# Patient Record
Sex: Male | Born: 1940 | Race: Black or African American | Hispanic: No | Marital: Married | Smoking: Former smoker
Health system: Southern US, Community
[De-identification: ages and names within clinical notes are randomized; demographics above are authoritative.]

---

## 2006-10-15 ENCOUNTER — Emergency Department (HOSPITAL_COMMUNITY): Admission: EM | Admit: 2006-10-15 | Discharge: 2006-10-15 | Payer: Self-pay | Admitting: Emergency Medicine

## 2021-01-07 ENCOUNTER — Other Ambulatory Visit: Payer: Self-pay | Admitting: Family Medicine

## 2021-01-07 ENCOUNTER — Other Ambulatory Visit (HOSPITAL_COMMUNITY): Payer: Self-pay | Admitting: Family Medicine

## 2021-01-07 DIAGNOSIS — R748 Abnormal levels of other serum enzymes: Secondary | ICD-10-CM

## 2021-01-19 ENCOUNTER — Other Ambulatory Visit: Payer: Self-pay

## 2021-01-19 ENCOUNTER — Ambulatory Visit (HOSPITAL_COMMUNITY)
Admission: RE | Admit: 2021-01-19 | Discharge: 2021-01-19 | Disposition: A | Discharge status: Home / Self Care | Payer: Medicare HMO | Source: Ambulatory Visit | Attending: Family Medicine | Admitting: Family Medicine

## 2021-01-19 DIAGNOSIS — R748 Abnormal levels of other serum enzymes: Secondary | ICD-10-CM | POA: Insufficient documentation

## 2021-01-21 ENCOUNTER — Other Ambulatory Visit: Payer: Self-pay | Admitting: Family Medicine

## 2021-01-22 ENCOUNTER — Other Ambulatory Visit: Payer: Self-pay | Admitting: Family Medicine

## 2021-01-22 ENCOUNTER — Other Ambulatory Visit (HOSPITAL_COMMUNITY): Payer: Self-pay | Admitting: Family Medicine

## 2021-01-22 DIAGNOSIS — R160 Hepatomegaly, not elsewhere classified: Secondary | ICD-10-CM

## 2021-01-22 DIAGNOSIS — K7460 Unspecified cirrhosis of liver: Secondary | ICD-10-CM

## 2021-01-22 DIAGNOSIS — K760 Fatty (change of) liver, not elsewhere classified: Secondary | ICD-10-CM

## 2021-01-29 ENCOUNTER — Ambulatory Visit (HOSPITAL_COMMUNITY)
Admission: RE | Admit: 2021-01-29 | Discharge: 2021-01-29 | Disposition: A | Discharge status: Home / Self Care | Payer: Medicare HMO | Source: Ambulatory Visit | Attending: Family Medicine | Admitting: Family Medicine

## 2021-01-29 DIAGNOSIS — K7460 Unspecified cirrhosis of liver: Secondary | ICD-10-CM

## 2021-01-29 DIAGNOSIS — K760 Fatty (change of) liver, not elsewhere classified: Secondary | ICD-10-CM

## 2021-01-29 DIAGNOSIS — R160 Hepatomegaly, not elsewhere classified: Secondary | ICD-10-CM

## 2021-01-29 MED ORDER — GADOBUTROL 1 MMOL/ML IV SOLN
10.0000 mL | Freq: Once | INTRAVENOUS | Status: AC | PRN
  Administered 2021-01-29: 10 mL via INTRAVENOUS

## 2022-05-08 IMAGING — MR MR ABDOMEN WO/W CM
20 series · 48 of 48 positions shown · IV contrast (gadavist)
Comparison: Ultrasound 01/19/2021

CLINICAL DATA: Abnormal liver function studies. History of
cirrhosis and increased hepatic echogenicity on ultrasound.

EXAM:
MRI ABDOMEN WITHOUT AND WITH CONTRAST
TECHNIQUE: Multiplanar multisequence MR imaging of the abdomen was performed
both before and after the administration of intravenous contrast.
CONTRAST:  10mL GADAVIST GADOBUTROL 1 MMOL/ML IV SOLN

[Series 3: cor haste · coronal · 6.0mm · 1.41mm/px · 2 of 34 slices shown]
[im 1/34]
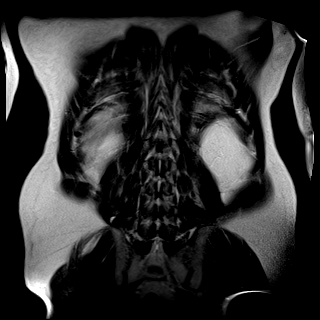
[im 34/34]
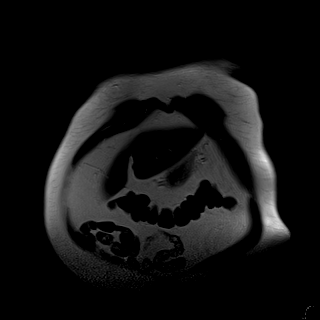

[Series 6: T2 fat-sat · axial · 6.0mm · 1.41mm/px · z∈[-182,+56]mm · 2 of 34 slices shown]
[im 1/34]
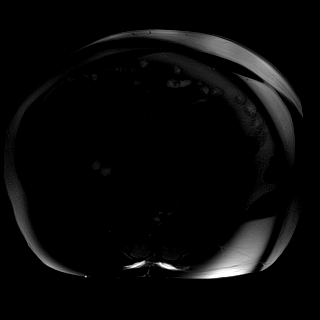
[im 34/34]
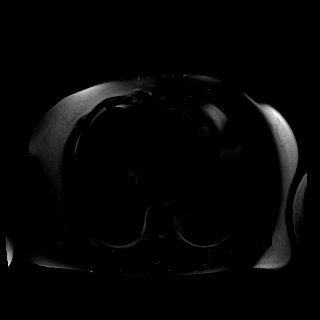

[Series 7: DWI · axial · 6.0mm · 1.68mm/px · z∈[-185,+67]mm · 2 of 36 slices shown (1 of 4)]
[im 1/36]
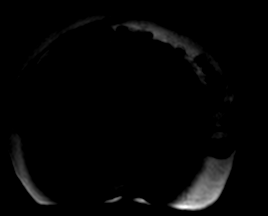
[im 36/36]
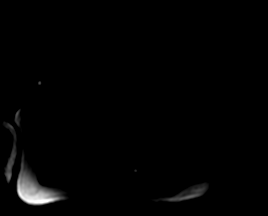

[Series 7: DWI · axial · 6.0mm · 1.68mm/px · 1 of 36 slices shown (2 of 4)]
[im 1/36]
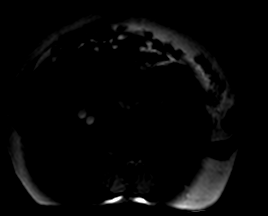

[Series 7: DWI · axial · 6.0mm · 1.68mm/px · 1 of 36 slices shown (3 of 4)]
[im 1/36]
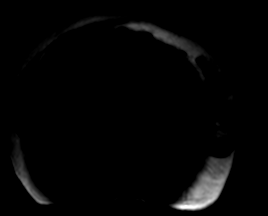

[Series 8: DWI · axial · 6.0mm · 1.68mm/px · 1 of 36 slices shown (4 of 4)]
[im 1/36]
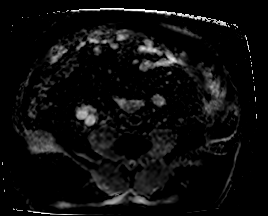

[Series 9: ax in and · axial · 3.0mm · 1.41mm/px · z∈[-191,+70]mm · 3 of 88 slices shown (1 of 2)]
[im 1/88]
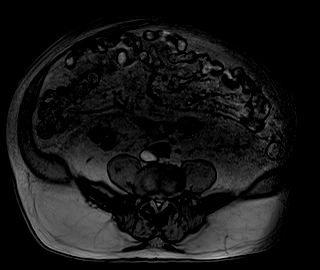
[im 44/88]
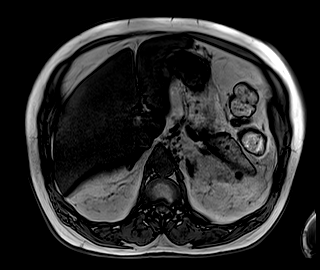
[im 88/88]
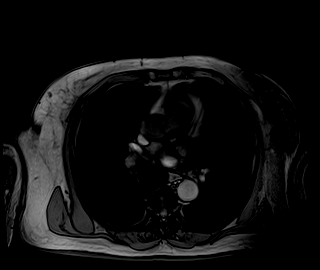

[Series 10: ax in and · axial · 3.0mm · 1.41mm/px · z∈[-191,+70]mm · 3 of 88 slices shown (2 of 2)]
[im 1/88]
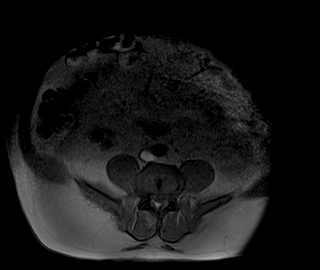
[im 44/88]
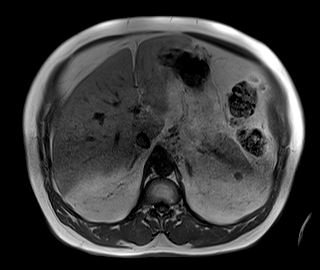
[im 88/88]
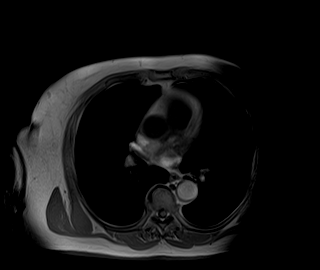

[Series 11: bSSFP · axial · 6.0mm · 0.88mm/px · z∈[-216,+66]mm · 2 of 48 slices shown]
[im 1/48]
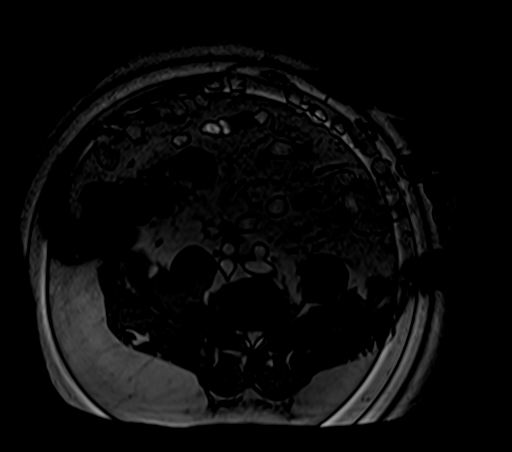
[im 48/48]
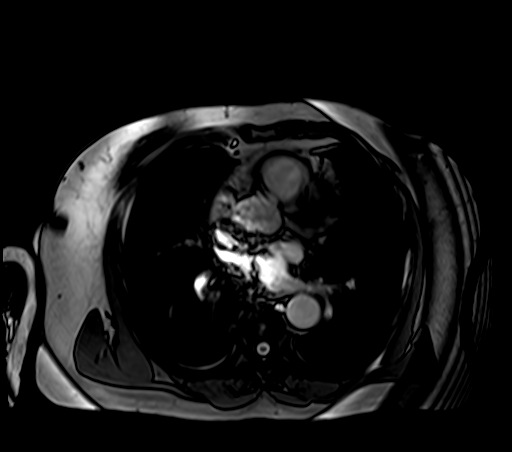

[Series 12: ax haste bh · axial · 6.0mm · 1.41mm/px · 1 of 36 slices shown]
[im 1/36]
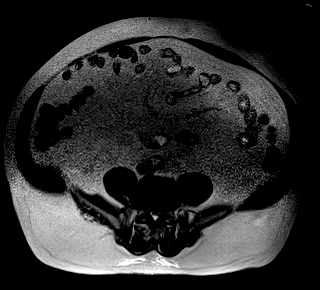

[Series 13: T1 dynamic · axial · 3.0mm · 1.41mm/px · z∈[-191,+70]mm · 3 of 88 slices shown (1 of 9)]
[im 1/88]
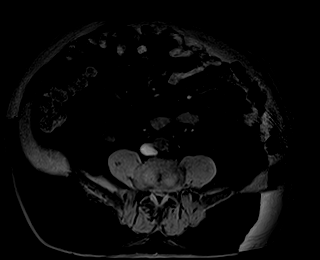
[im 44/88]
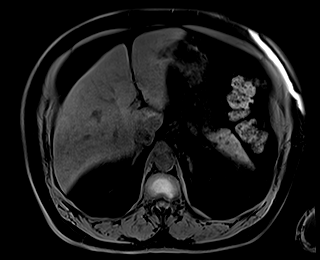
[im 88/88]
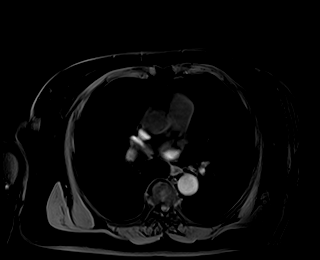

[Series 15: T1 dynamic · axial · 3.0mm · 1.41mm/px · z∈[-191,+70]mm · 3 of 88 slices shown (2 of 9)]
[im 1/88]
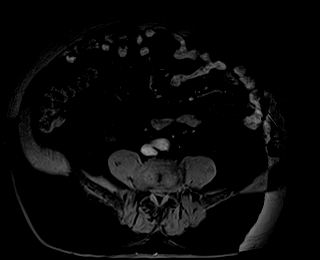
[im 44/88]
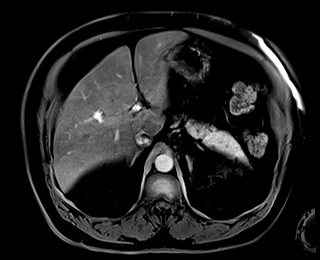
[im 88/88]
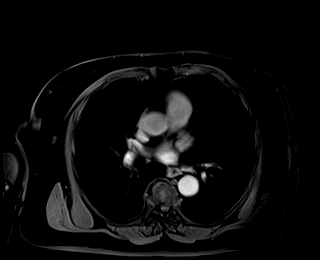

[Series 16: T1 dynamic · axial · 3.0mm · 1.41mm/px · z∈[-191,+70]mm · 3 of 88 slices shown (3 of 9)]
[im 1/88]
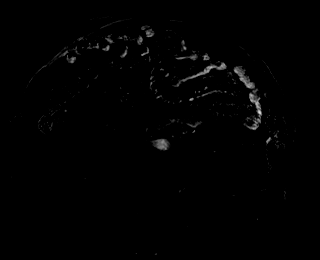
[im 44/88]
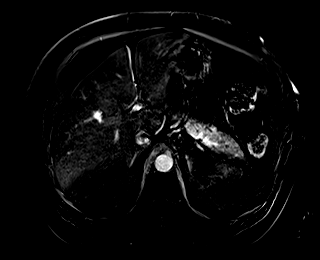
[im 88/88]
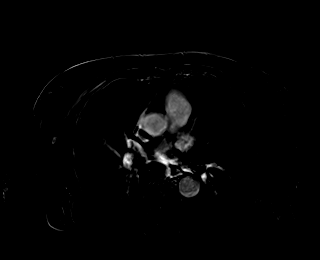

[Series 17: T1 dynamic · axial · 3.0mm · 1.41mm/px · z∈[-191,+70]mm · 3 of 88 slices shown (4 of 9)]
[im 1/88]
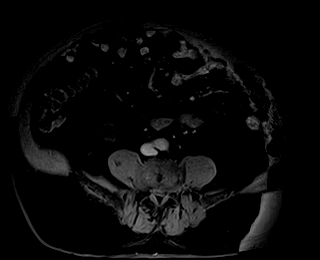
[im 44/88]
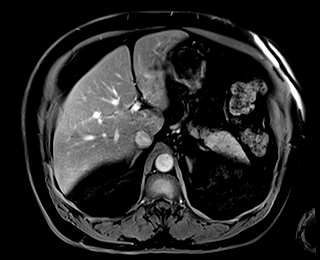
[im 88/88]
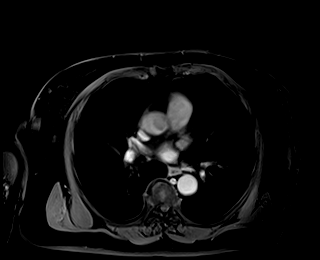

[Series 18: T1 dynamic · axial · 3.0mm · 1.41mm/px · z∈[-191,+70]mm · 3 of 88 slices shown (5 of 9)]
[im 1/88]
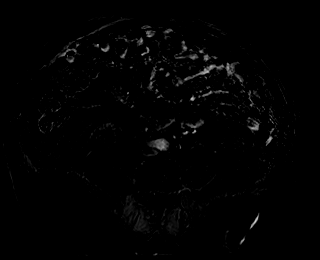
[im 44/88]
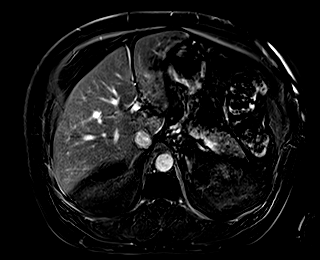
[im 88/88]
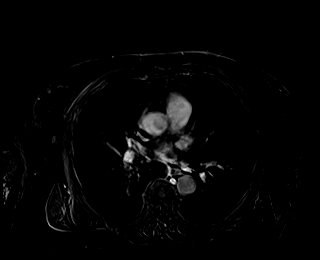

[Series 19: T1 dynamic · axial · 3.0mm · 1.41mm/px · z∈[-191,+70]mm · 3 of 88 slices shown (6 of 9)]
[im 1/88]
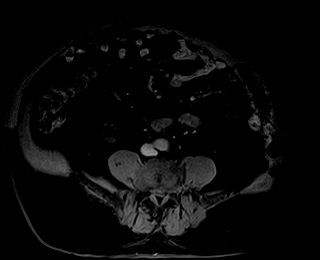
[im 44/88]
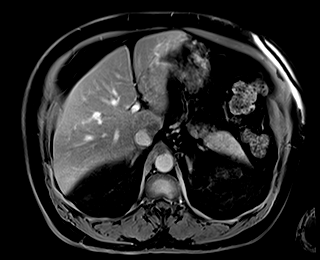
[im 88/88]
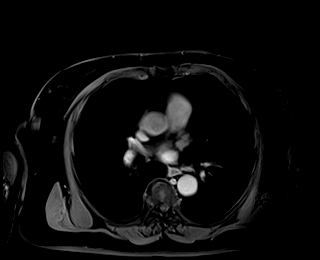

[Series 20: T1 dynamic · axial · 3.0mm · 1.41mm/px · z∈[-191,+70]mm · 3 of 88 slices shown (7 of 9)]
[im 1/88]
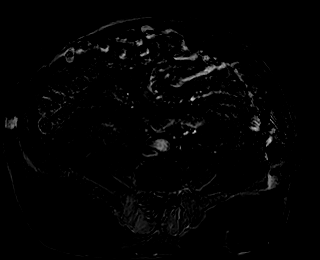
[im 44/88]
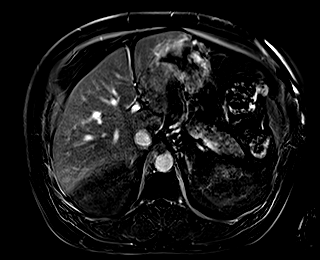
[im 88/88]
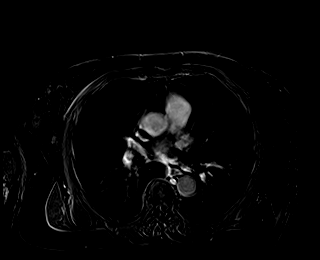

[Series 21: T1 dynamic · axial · 3.0mm · 1.41mm/px · z∈[-191,+70]mm · 3 of 88 slices shown (8 of 9)]
[im 1/88]
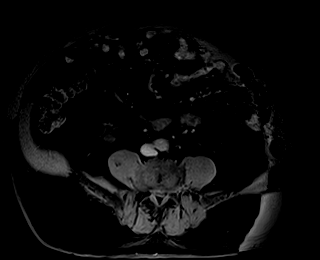
[im 44/88]
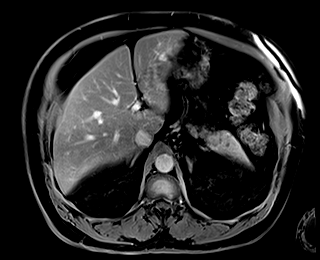
[im 88/88]
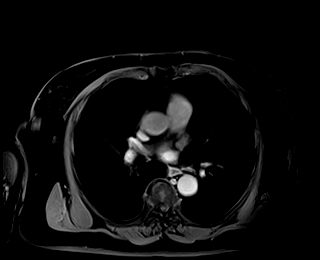

[Series 22: T1 dynamic · axial · 3.0mm · 1.41mm/px · z∈[-191,+70]mm · 3 of 88 slices shown (9 of 9)]
[im 1/88]
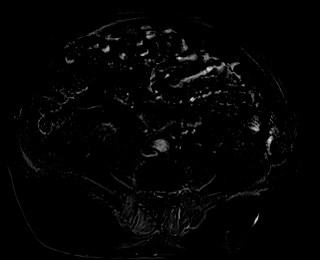
[im 44/88]
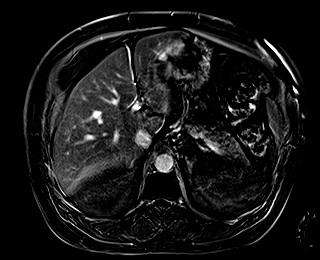
[im 88/88]
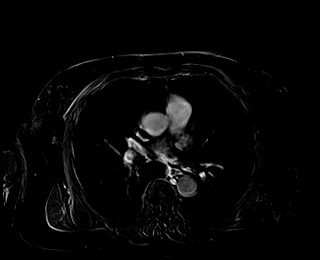

[Series 23: T1 dynamic post-contrast · coronal · 4.0mm · 1.41mm/px · 3 of 80 slices shown]
[im 1/80]
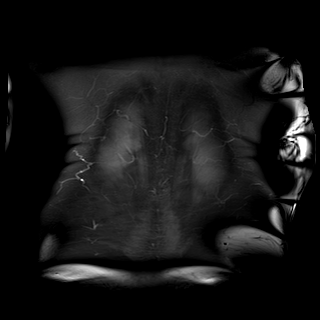
[im 40/80]
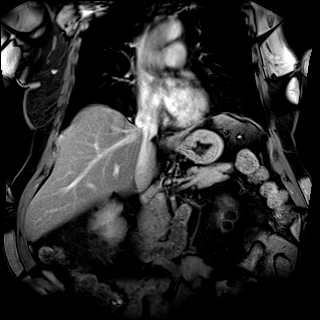
[im 80/80]
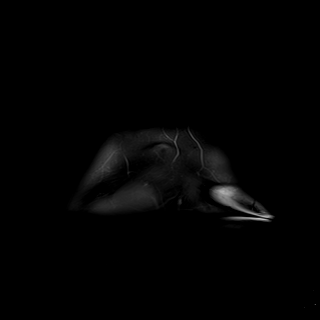

[48 of 48 positions shown; findings below may reference images not displayed]

FINDINGS: Lower chest:  The visualized lower chest appears unremarkable.

Hepatobiliary: Diffuse hepatic steatosis with marked loss of signal
throughout the liver on the gradient echo opposed phase images.
There are no definite morphologic changes of cirrhosis. There are no
arterial phase enhancing lesions or other focal hepatic
abnormalities. The portal and hepatic veins appear normal. The
gallbladder is incompletely distended. No evidence of gallstones,
gallbladder wall thickening or biliary dilatation.

Pancreas: Unremarkable. No pancreatic ductal dilatation or
surrounding inflammatory changes.

Spleen: Normal in size without focal abnormality.

Adrenals/Urinary Tract: Both adrenal glands appear normal. There are
numerous simple appearing renal cysts bilaterally. No evidence of
enhancing renal mass or hydronephrosis.

Stomach/Bowel: Ingested material in the stomach. No evidence of
bowel distension, wall thickening or surrounding inflammation.

Vascular/Lymphatic: There are prominent lymph nodes in the porta
hepatis, likely reactive. No retroperitoneal lymphadenopathy. No
acute vascular findings. Aortic atherosclerosis noted.

Other: The visualized abdominal wall is intact.  No ascites.

Musculoskeletal: No acute or significant osseous findings.
Multilevel lumbar spondylosis.
IMPRESSION: 1. Severe hepatic steatosis. No definite morphologic changes of
cirrhosis or focal hepatic abnormality identified.
2. No signs of portal hypertension
3. Prominent lymph nodes in the porta hepatis, nonspecific, but
likely reactive.
4. Numerous simple appearing renal cysts bilaterally.

## 2022-05-24 ENCOUNTER — Ambulatory Visit (HOSPITAL_COMMUNITY)
Admission: RE | Admit: 2022-05-24 | Discharge: 2022-05-24 | Disposition: A | Discharge status: Home / Self Care | Payer: Medicare HMO | Source: Ambulatory Visit | Attending: Internal Medicine | Admitting: Internal Medicine

## 2022-05-24 ENCOUNTER — Encounter: Payer: Self-pay | Admitting: Internal Medicine

## 2022-05-24 ENCOUNTER — Ambulatory Visit: Payer: Medicare HMO | Admitting: Internal Medicine

## 2022-05-24 VITALS — BP 124/78 | HR 84 | Temp 98.7°F | Ht 68.0 in | Wt 235.0 lb

## 2022-05-24 DIAGNOSIS — I10 Essential (primary) hypertension: Secondary | ICD-10-CM | POA: Diagnosis not present

## 2022-05-24 DIAGNOSIS — R058 Other specified cough: Secondary | ICD-10-CM | POA: Insufficient documentation

## 2022-05-24 MED ORDER — METHYLPREDNISOLONE ACETATE 80 MG/ML IJ SUSP
120.0000 mg | Freq: Once | INTRAMUSCULAR | Status: AC
  Administered 2022-05-24: 120 mg via INTRAMUSCULAR

## 2022-05-24 MED ORDER — PANTOPRAZOLE SODIUM 40 MG PO TBEC: 40.0000 mg | DELAYED_RELEASE_TABLET | Freq: Every day | ORAL | 2 refills | Status: DC

## 2022-05-24 MED ORDER — VALSARTAN-HYDROCHLOROTHIAZIDE 160-12.5 MG PO TABS: 1.0000 | ORAL_TABLET | Freq: Every day | ORAL | 11 refills | Status: AC

## 2022-05-24 NOTE — Assessment & Plan Note (Addendum)
Onset  ? 1980s -  Allergy screen 05/24/2022 >  Eos 0. /  IgE   - trial off ACEi and on aggressive gerd rx plus 1st gen H1 blockers per guidelines   05/24/2022 >>>   Upper airway cough syndrome (previously labeled PNDS),  is so named because it's frequently impossible to sort out how much is  CR/sinusitis with freq throat clearing (which can be related to primary GERD)   vs  causing  secondary (" extra esophageal")  GERD from wide swings in gastric pressure that occur with throat clearing, often  promoting self use of mint and menthol lozenges that reduce the lower esophageal sphincter tone and exacerbate the problem further in a cyclical fashion.   These are the same pts (now being labeled as having "irritable larynx syndrome" by some cough centers) who not infrequently have a history of having failed to tolerate ace inhibitors,  dry powder inhalers or biphosphonates or report having atypical/extraesophageal reflux symptoms that don't respond to standard doses of PPI  and are easily confused as having aecopd or asthma flares by even experienced allergists/ pulmonologists (myself included).   rec rx as above then regroup in 4 weeks

## 2022-05-24 NOTE — Progress Notes (Unsigned)
Ronald Rubio, male    DOB: [birth year]    MRN: [id number]   Brief patient profile:  27  yobm  quit smoking 2018  referred to pulmonary clinic in [place]  05/24/2022 by Noland Fordyce for cough  x years  and sob since covid but can't remember when that was but not admitted.      History of Present Illness  05/24/2022  Pulmonary/ 1st office eval/ Ronald Rubio / [place] [facility]  Chief Complaint  Patient presents with   Consult    Chronic cough   Dyspnea:  only with digging / yardwork not better with saba  Cough: worse  hs  no am flare / no significant production assoc with sense  nasal congestion/pnds  Sleep: bed flat is flat  SABA use: twice  a day   No obvious day to day or daytime pattern/variability or assoc excess/ purulent sputum or mucus plugs or hemoptysis or cp or chest tightness, subjective wheeze or overt  hb symptoms.    . Also denies any obvious fluctuation of symptoms with weather or environmental changes or other aggravating or alleviating factors except as outlined above   No unusual exposure hx or h/o childhood pna/ asthma or knowledge of premature birth.  Current Allergies, Complete Past Medical History, Past Surgical History, Family History, and Social History were reviewed in [facility] Rubio record.  ROS  The following are not active complaints unless bolded Hoarseness, sore throat, dysphagia, dental problems, itching, sneezing,  nasal congestion or discharge of excess mucus or purulent secretions, ear ache,   fever, chills, sweats, unintended wt loss or wt gain, classically pleuritic or exertional cp,  orthopnea pnd or arm/hand swelling  or leg swelling, presyncope, palpitations, abdominal pain, anorexia, nausea, vomiting, diarrhea  or change in bowel habits or change in bladder habits, change in stools or change in urine, dysuria, hematuria,  rash, arthralgias, visual complaints, headache, numbness, weakness or ataxia or problems with walking or  coordination,  change in mood or  memory.           No past medical history on file.  Outpatient Medications Prior to Visit  Medication Sig Dispense Refill   albuterol (VENTOLIN HFA) 108 (90 Base) MCG/ACT inhaler Inhale into the lungs.     aspirin EC 81 MG tablet Take 1 tablet by mouth daily.     famotidine (PEPCID) 20 MG tablet Take 20 mg by mouth 2 (two) times daily.     rosuvastatin (CRESTOR) 20 MG tablet Take 1 tablet by mouth daily.     tamsulosin (FLOMAX) 0.4 MG CAPS capsule Take by mouth.     cetirizine (ZYRTEC) 5 MG tablet Take 5 mg by mouth at bedtime.     lisinopril-hydrochlorothiazide (ZESTORETIC) 20-12.5 MG tablet Take 1 tablet by mouth daily.     No facility-administered medications prior to visit.     Objective:     BP 124/78 (BP Location: Right Arm, Patient Position: Sitting)   Pulse 84   Temp 98.7 F (37.1 C) (Temporal)   Ht 5\' 8"  (1.727 m)   Wt 235 lb (106.6 kg)   SpO2 95% Comment: ra  BMI 35.73 kg/m   SpO2: 95 % (ra)  Amb bm pot belly    HEENT : Oropharynx  clear/ no pnd/cobblestoning     Nasal turbinates min TE   NECK :  without  apparent JVD/ palpable Nodes/TM    LUNGS: no acc muscle use,  Nl contour chest which is clear to A and  P bilaterally without cough on insp or exp maneuvers   CV:  RRR  no s3 or murmur or increase in P2, and no edema   ABD: obese  soft and nontender with nl inspiratory excursion in the supine position. No bruits or organomegaly appreciated   MS:  Nl gait/ ext warm without deformities Or obvious joint restrictions  calf tenderness, cyanosis or clubbing    SKIN: warm and dry without lesions    NEURO:  alert, approp, nl sensorium with  no motor or cerebellar deficits apparent.    CXR PA and Lateral:   05/24/2022 :    I personally reviewed images and agree with radiology impression as follows:   Coarse interstitial opacities with peribronchial cuffing suggestive of chronic bronchitis. No focal consolidation, pleural  effusion, or pneumothorax. Normal cardiomediastinal silhouette. Aortic calcification. No acute osseous abnormality. My impression:  really there no impressive or clinically significant  findings at all      Assessment   Upper airway cough syndrome Onset  ? 1980s -  Allergy screen 05/24/2022 >  Eos 0. /  IgE   - trial off ACEi and on aggressive gerd rx plus 1st gen H1 blockers per guidelines   05/24/2022 >>>   Upper airway cough syndrome (previously labeled PNDS),  is so named because it's frequently impossible to sort out how much is  CR/sinusitis with freq throat clearing (which can be related to primary GERD)   vs  causing  secondary (" extra esophageal")  GERD from wide swings in gastric pressure that occur with throat clearing, often  promoting self use of mint and menthol lozenges that reduce the lower esophageal sphincter tone and exacerbate the problem further in a cyclical fashion.   These are the same pts (now being labeled as having "irritable larynx syndrome" by some cough centers) who not infrequently have a history of having failed to tolerate ace inhibitors,  dry powder inhalers or biphosphonates or report having atypical/extraesophageal reflux symptoms that don't respond to standard doses of PPI  and are easily confused as having aecopd or asthma flares by even experienced allergists/ pulmonologists (myself included).   rec rx as above then regroup in 4 weeks   Essential hypertension D/c acei 05/24/2022 due to cough   ACE inhibitors are problematic in  pts with airway complaints because  even experienced pulmonologists can't always distinguish ace effects from copd/asthma.  By themselves they don't actually cause a problem, much like oxygen can't by itself start a fire, but they certainly serve as a powerful catalyst or enhancer for any "fire"  or inflammatory process in the upper airway, be it caused by a virus or more commonly reflux (especially in the obese or pts with known GERD or  who are on biphoshonates).     In the era of ARB near equivalency until we have a better handle on the reversibility of the airway problem, it just makes sense to avoid ACEI  entirely in the short run and then decide later, having established a level of airway control using a reasonable limited regimen, whether to add back ace but even then being very careful to observe the pt for worsening airway control and number of meds used/ needed to control symptoms.     >>> f/u in 4 weeks, call sooner if needed         Each maintenance medication was reviewed in detail including emphasizing most importantly the difference between maintenance and prns and under what circumstances the prns  are to be triggered using an action plan format where appropriate.  Total time for H and P, chart review, counseling,  and generating customized AVS unique to this office visit / same day charting  > 45 min pt new to me           Sandrea Hughs, MD 05/25/2022

## 2022-05-24 NOTE — Patient Instructions (Addendum)
Stop lisinopril and the cough should gradually fade if not resolve completely over the next few weeks   Start valsartan 160 - 12.5 one daily   Depomedrol 120 mg IM   Cough > mucinex 1200 mg every 12 hours as needed   Stop zyrtec and For drainage / throat tickle try take CHLORPHENIRAMINE  4 mg  ("Allergy Relief" 4mg   at [facility] should be easiest to find in the blue box usually on bottom shelf)  take one every 4 hours as needed - extremely effective and inexpensive over the counter- may cause drowsiness so start with just a dose or two an hour before bedtime and see how you tolerate it before trying in daytime.   Pantoprazole (protonix) 40 mg   Take  30-60 min before first meal of the day and Pepcid (famotidine)  20 mg after supper until return to office - this is the best way to tell whether stomach acid is contributing to your problem.    GERD (REFLUX)  is an extremely common cause of respiratory symptoms just like yours , many times with no obvious heartburn at all.    It can be treated with medication, but also with lifestyle changes including elevation of the head of your bed (ideally with 6 -8inch blocks under the headboard of your bed),  Smoking cessation, avoidance of late meals, excessive alcohol, and avoid fatty foods, chocolate, peppermint, colas, red wine, and acidic juices such as orange juice.  NO MINT OR MENTHOL PRODUCTS SO NO COUGH DROPS  USE SUGARLESS CANDY INSTEAD (Jolley ranchers or Stover's or Life Savers) or even ice chips will also do - the key is to swallow to prevent all throat clearing. NO OIL BASED VITAMINS - use powdered substitutes.  Avoid fish oil when coughing.   Please remember to go to the lab department   for your tests - we will call you with the results when they are available.      Please remember to go to the  x-ray department  @  [facility] for your tests - we will call you with the results when they are available     Please schedule a  follow up office visit in 4 weeks, call sooner if needed

## 2022-05-25 ENCOUNTER — Encounter: Payer: Self-pay | Admitting: Internal Medicine

## 2022-05-25 NOTE — Assessment & Plan Note (Addendum)
D/c acei 05/24/2022 due to cough   ACE inhibitors are problematic in  pts with airway complaints because  even experienced pulmonologists can't always distinguish ace effects from copd/asthma.  By themselves they don't actually cause a problem, much like oxygen can't by itself start a fire, but they certainly serve as a powerful catalyst or enhancer for any "fire"  or inflammatory process in the upper airway, be it caused by a virus or more commonly reflux (especially in the obese or pts with known GERD or who are on biphoshonates).     In the era of ARB near equivalency until we have a better handle on the reversibility of the airway problem, it just makes sense to avoid ACEI  entirely in the short run and then decide later, having established a level of airway control using a reasonable limited regimen, whether to add back ace but even then being very careful to observe the pt for worsening airway control and number of meds used/ needed to control symptoms.     >>> f/u in 4 weeks, call sooner if needed         Each maintenance medication was reviewed in detail including emphasizing most importantly the difference between maintenance and prns and under what circumstances the prns are to be triggered using an action plan format where appropriate.  Total time for H and P, chart review, counseling,  and generating customized AVS unique to this office visit / same day charting  > 45 min pt new to me

## 2022-05-27 ENCOUNTER — Telehealth: Payer: Self-pay | Admitting: Internal Medicine

## 2022-05-27 LAB — CBC WITH DIFFERENTIAL/PLATELET
Basophils Absolute: 0 10*3/uL (ref 0.0–0.2)
Basos: 1 %
EOS (ABSOLUTE): 0.1 10*3/uL (ref 0.0–0.4)
Eos: 1 %
Hematocrit: 37.7 % (ref 37.5–51.0)
Hemoglobin: 12.5 g/dL — ABNORMAL LOW (ref 13.0–17.7)
Immature Grans (Abs): 0 10*3/uL (ref 0.0–0.1)
Immature Granulocytes: 0 %
Lymphocytes Absolute: 2.3 10*3/uL (ref 0.7–3.1)
Lymphs: 29 %
MCH: 30.4 pg (ref 26.6–33.0)
MCHC: 33.2 g/dL (ref 31.5–35.7)
MCV: 92 fL (ref 79–97)
Monocytes Absolute: 0.7 10*3/uL (ref 0.1–0.9)
Monocytes: 9 %
Neutrophils Absolute: 4.7 10*3/uL (ref 1.4–7.0)
Neutrophils: 60 %
Platelets: 285 10*3/uL (ref 150–450)
RBC: 4.11 x10E6/uL — ABNORMAL LOW (ref 4.14–5.80)
RDW: 13.2 % (ref 11.6–15.4)
WBC: 7.9 10*3/uL (ref 3.4–10.8)

## 2022-05-27 LAB — IGE: IgE (Immunoglobulin E), Serum: 122 IU/mL (ref 6–495)

## 2022-05-27 NOTE — Telephone Encounter (Signed)
Addressed in result note.  

## 2022-06-23 ENCOUNTER — Ambulatory Visit: Payer: Medicare HMO | Admitting: Internal Medicine

## 2022-06-23 ENCOUNTER — Encounter: Payer: Self-pay | Admitting: Internal Medicine

## 2022-06-23 DIAGNOSIS — I10 Essential (primary) hypertension: Secondary | ICD-10-CM | POA: Diagnosis not present

## 2022-06-23 DIAGNOSIS — R058 Other specified cough: Secondary | ICD-10-CM | POA: Diagnosis not present

## 2022-06-23 NOTE — Assessment & Plan Note (Addendum)
D/c acei 05/24/2022 due to cough > much better 06/23/2022   Although even in retrospect it may not be clear the ACEi contributed to the pt's symptoms,  Pt improved off them and adding them back at this point or in the future would risk confusion in interpretation of non-specific respiratory symptoms to which this patient is prone  ie  Better not to muddy the waters here.   In terms of bp >> Adequate control on present rx, reviewed in detail with pt > no change in rx needed  = diovan 160/12.5 with f/u per PCP  already arranged.            Each maintenance medication was reviewed in detail including emphasizing most importantly the difference between maintenance and prns and under what circumstances the prns are to be triggered using an action plan format where appropriate.  Total time for H and P, chart review, counseling, and generating customized AVS unique to this office visit / same day charting =  25 min

## 2022-06-23 NOTE — Progress Notes (Signed)
Ronald Rubio, male    DOB: 26-Oct-1940    MRN: [id number]   Brief patient profile:  4  yobm  quit smoking 2018  referred to pulmonary clinic in [place]  05/24/2022 by Noland Fordyce for cough  x years  and sob since covid but can't remember when that was but not admitted.      History of Present Illness  05/24/2022  Pulmonary/ 1st office eval/ Sebert Stollings / [place] [facility]  Chief Complaint  Patient presents with   Consult    Chronic cough   Dyspnea:  only with digging / yardwork not better with saba  Cough: worse  hs  no am flare / no significant production assoc with sense  nasal congestion/pnds  Sleep: bed flat is flat  SABA use: twice  a day  Rec Stop lisinopril and the cough should gradually fade if not resolve completely over the next few weeks  Start valsartan 160 - 12.5 one daily  Depomedrol 120 mg IM  Cough > mucinex 1200 mg every 12 hours as needed  Stop zyrtec and For drainage / throat tickle try take CHLORPHENIRAMINE  4 mg  Pantoprazole (protonix) 40 mg   Take  30-60 min before first meal of the day and Pepcid (famotidine)  20 mg after supper until return to office GERD rx    05/24/2022 >  Eos 0.1 /  IgE  122      06/23/2022  f/u ov/[place] [facility]/Aliene Tamura re: longstanding  cough  maint on gerd rx  and off ACEi since last ov Chief Complaint  Patient presents with   Follow-up    Cough has improved   Dyspnea:  back at work at [employer] not limited  Cough: almost completely gone / minimal pnds controlled with 1st gen H1 blockers   Sleeping: flat bed/ 2 pillows  SABA use: none  02: none  Covid status: vax x 3      No obvious day to day or daytime variability or assoc excess/ purulent sputum or mucus plugs or hemoptysis or cp or chest tightness, subjective wheeze or overt sinus or hb symptoms.   Sleeping  without nocturnal  or early am exacerbation  of respiratory  c/o's or need for noct saba. Also denies any obvious fluctuation of symptoms with weather or  environmental changes or other aggravating or alleviating factors except as outlined above   No unusual exposure hx or h/o childhood pna/ asthma or knowledge of premature birth.  Current Allergies, Complete Past Medical History, Past Surgical History, Family History, and Social History were reviewed in [employer] record.  ROS  The following are not active complaints unless bolded Hoarseness, sore throat, dysphagia, dental problems, itching, sneezing,  nasal congestion or discharge of excess mucus or purulent secretions, ear ache,   fever, chills, sweats, unintended wt loss or wt gain, classically pleuritic or exertional cp,  orthopnea pnd or arm/hand swelling  or leg swelling, presyncope, palpitations, abdominal pain, anorexia, nausea, vomiting, diarrhea  or change in bowel habits or change in bladder habits, change in stools or change in urine, dysuria, hematuria,  rash, arthralgias, visual complaints, headache, numbness, weakness or ataxia or problems with walking or coordination,  change in mood or  memory.        Current Meds  Medication Sig   aspirin EC 81 MG tablet Take 1 tablet by mouth daily.   chlorpheniramine (CHLOR-TRIMETON) 4 MG tablet 1 tablet as needed Orally every 4 hrs   famotidine (PEPCID) 20  MG tablet Take 20 mg by mouth 2 (two) times daily.   glipiZIDE (GLUCOTROL) 10 MG tablet TAKE 1 TABLET BY MOUTH TWICE DAILY WITH FOOD. NEEDS APPOINTMENT FOR FUTURE REFILLS for 90   metFORMIN (GLUCOPHAGE-XR) 500 MG 24 hr tablet SMARTSIG:1 Tablet(s) By Mouth Every Evening   pantoprazole (PROTONIX) 40 MG tablet Take 1 tablet (40 mg total) by mouth daily. Take 30-60 min before first meal of the day   rosuvastatin (CRESTOR) 20 MG tablet Take 1 tablet by mouth daily.   tamsulosin (FLOMAX) 0.4 MG CAPS capsule Take by mouth.   valsartan-hydrochlorothiazide (DIOVAN HCT) 160-12.5 MG tablet Take 1 tablet by mouth daily.            Objective:    Wt Readings from Last 3  Encounters:  06/23/22 231 lb 6.4 oz (105 kg)  05/24/22 235 lb (106.6 kg)      Vital signs reviewed  06/23/2022  - Note at rest 02 sats  96% on RA   General appearance:    amb mod obese bm minimal hoarseness   HEENT : Oropharynx  clear/ s excess pnd     Nasal turbinates min edema/ non specific pattern    NECK :  without  apparent JVD/ palpable Nodes/TM    LUNGS: no acc muscle use,  Nl contour chest which is clear to A and P bilaterally without cough on insp or exp maneuvers   CV:  RRR  no s3 or murmur or increase in P2, and no edema   ABD:  soft and nontender with nl inspiratory excursion in the supine position. No bruits or organomegaly appreciated   MS:  Nl gait/ ext warm without deformities Or obvious joint restrictions  calf tenderness, cyanosis or clubbing    SKIN: warm and dry without lesions    NEURO:  alert, approp, nl sensorium with  no motor or cerebellar deficits apparent.       Assessment

## 2022-06-23 NOTE — Assessment & Plan Note (Signed)
Onset  ? 1980s - Allergy screen 05/24/2022 >  Eos 0.1 /  IgE  122 - trial off ACEi and on aggressive gerd rx plus 1st gen H1 blockers per guidelines   05/24/2022 >>>  Improved 90+ % 06/23/2022 > continue 1st gen H1 blockers per guidelines  And gerd rx x 2 m then return to taper off gerd rx   Upper airway cough syndrome (previously labeled PNDS),  is so named because it's frequently impossible to sort out how much is  CR/sinusitis with freq throat clearing (which can be related to primary GERD)   vs  causing  secondary (" extra esophageal")  GERD from wide swings in gastric pressure that occur with throat clearing, often  promoting self use of mint and menthol lozenges that reduce the lower esophageal sphincter tone and exacerbate the problem further in a cyclical fashion.   These are the same pts (now being labeled as having "irritable larynx syndrome" by some cough centers) who not infrequently have a history of having failed to tolerate ace inhibitors,  dry powder inhalers or biphosphonates or report having atypical/extraesophageal reflux symptoms that don't respond to standard doses of PPI  and are easily confused as having aecopd or asthma flares by even experienced allergists/ pulmonologists (myself included).   rec as above/ f/u in 2 months to try taper off PPI

## 2022-06-23 NOTE — Patient Instructions (Addendum)
For drainage / throat tickle /sneezing/ try take CHLORPHENIRAMINE  4 mg  ("Allergy Relief" 4mg   at [facility] should be easiest to find in the blue box usually on bottom shelf)  take one every 4 hours as needed - extremely effective and inexpensive over the counter- may cause drowsiness so start with just a dose or two an hour before bedtime and see how you tolerate it before trying in daytime.    Please schedule a follow up visit in 2  months but call sooner if needed

## 2022-08-18 ENCOUNTER — Ambulatory Visit: Payer: Medicare HMO | Admitting: Internal Medicine

## 2022-08-18 ENCOUNTER — Encounter: Payer: Self-pay | Admitting: Internal Medicine

## 2022-08-18 VITALS — BP 132/78 | HR 84 | Temp 98.4°F | Ht 68.0 in | Wt 233.4 lb

## 2022-08-18 DIAGNOSIS — R058 Other specified cough: Secondary | ICD-10-CM

## 2022-08-18 LAB — POCT EXHALED NITRIC OXIDE: FeNO level (ppb): 19

## 2022-08-18 MED ORDER — METHYLPREDNISOLONE ACETATE 80 MG/ML IJ SUSP
120.0000 mg | Freq: Once | INTRAMUSCULAR | Status: AC
  Administered 2022-08-18: 120 mg via INTRAMUSCULAR

## 2022-08-18 NOTE — Patient Instructions (Signed)
Depomedrol 120 mg IM   Cough > mucinex 1200 mg every 12 hours as needed   Stop zyrtec and For drainage / throat tickle try take CHLORPHENIRAMINE  4 mg  ("Allergy Relief" 4mg   at [facility] should be easiest to find in the blue box usually on bottom shelf)  take one every 4 hours as needed - extremely effective and inexpensive over the counter- may cause drowsiness so start with just a dose or two an hour before bedtime and see how you tolerate it before trying in daytime.   Pantoprazole (protonix) 40 mg   Take  30-60 min before first meal of the day and Pepcid (famotidine)  20 mg after supper until return to office - this is the best way to tell whether stomach acid is contributing to your problem.    GERD (REFLUX)  is an extremely common cause of respiratory symptoms just like yours , many times with no obvious heartburn at all.    It can be treated with medication, but also with lifestyle changes including elevation of the head of your bed (ideally with 6 -8inch blocks under the headboard of your bed),  Smoking cessation, avoidance of late meals, excessive alcohol, and avoid fatty foods, chocolate, peppermint, colas, red wine, and acidic juices such as orange juice.  NO MINT OR MENTHOL PRODUCTS SO NO COUGH DROPS  USE SUGARLESS CANDY INSTEAD (Jolley ranchers or Stover's or Life Savers) or even ice chips will also do - the key is to swallow to prevent all throat clearing. NO OIL BASED VITAMINS - use powdered substitutes.  Avoid fish oil when coughing.   Please schedule a follow up office visit in 4 weeks, sooner if needed  with all medications /inhalers/ solutions in hand so we can verify exactly what you are taking. This includes all medications from all doctors and over the counters

## 2022-08-18 NOTE — Progress Notes (Signed)
Ronald Rubio, male    DOB: [birth year]    MRN: [id number]   Brief patient profile:  69  yobm  quit smoking 2018  referred to pulmonary clinic in [place]  05/24/2022 by Ronald Rubio for cough  x years (?since childhood)  and sob since covid but can't remember when that was but not admitted.     History of Present Illness  05/24/2022  Pulmonary/ 1st Rubio eval/ Ronald Rubio  Chief Complaint  Patient presents with   Consult    Chronic cough   Dyspnea:  only with digging / yardwork not better with saba  Cough: worse  hs  no am flare / no significant production assoc with sense  nasal congestion/pnds  Sleep: bed flat is flat  SABA use: twice  a day  Rec Stop lisinopril and the cough should gradually fade if not resolve completely over the next few weeks  Start valsartan 160 - 12.5 one daily  Depomedrol 120 mg IM  Cough > mucinex 1200 mg every 12 hours as needed  Stop zyrtec and For drainage / throat tickle try take CHLORPHENIRAMINE  4 mg  Pantoprazole (protonix) 40 mg   Take  30-60 min before first meal of the day and Pepcid (famotidine)  20 mg after supper until return to Rubio GERD rx    05/24/2022 >  Eos 0.1 /  IgE  122      06/23/2022  f/u ov/Ronald Rubio/Ronald Rubio re: longstanding  cough  maint on gerd rx  and off ACEi since last ov Chief Complaint  Patient presents with   Follow-up    Cough has improved   Dyspnea:  back at work at [employer] not limited  Cough: almost completely gone / minimal pnds controlled with 1st gen H1 blockers   Sleeping: flat bed/ 2 pillows  SABA use: none  02: none  Covid status: vax x 3  Rec For drainage / throat tickle /sneezing/ try take CHLORPHENIRAMINE  4 mg       08/18/2022  f/u ov/[place] Rubio/Ronald Rubio re: recurrent cough x years  maint on gerd rx   Chief Complaint  Patient presents with   Follow-up    Still coughing- states cough goes away and comes back. He is now coughing up white sputum  Dyspnea:  no trouble  with work  Cough: resolved for a week or two then comes back "like a cold"  Sleeping: white mucus does keep him up despite h1 at hs  SABA use: none  02: none     No obvious day to day or daytime variability or assoc excess/ purulent sputum or mucus plugs or hemoptysis or cp or chest tightness, subjective wheeze or overt sinus or hb symptoms.    . Also denies any obvious fluctuation of symptoms with weather or environmental changes or other aggravating or alleviating factors except as outlined above   No unusual exposure hx or h/o childhood pna/ asthma or knowledge of premature birth.  Current Allergies, Complete Past Medical History, Past Surgical History, Family History, and Social History were reviewed in [employer] record.  ROS  The following are not active complaints unless bolded Hoarseness, sore throat, dysphagia, dental problems, itching, sneezing,  nasal congestion or discharge of excess mucus or purulent secretions, ear ache,   fever, chills, sweats, unintended wt loss or wt gain, classically pleuritic or exertional cp,  orthopnea pnd or arm/hand swelling  or leg swelling, presyncope, palpitations, abdominal pain, anorexia, nausea, vomiting, diarrhea  or change in bowel habits or change in bladder habits, change in stools or change in urine, dysuria, hematuria,  rash, arthralgias, visual complaints, headache, numbness, weakness or ataxia or problems with walking or coordination,  change in mood or  memory.        Current Meds  Medication Sig   aspirin EC 81 MG tablet Take 1 tablet by mouth daily.   chlorpheniramine (CHLOR-TRIMETON) 4 MG tablet 1 tablet as needed Orally every 4 hrs   famotidine (PEPCID) 20 MG tablet Take 20 mg by mouth 2 (two) times daily.   glipiZIDE (GLUCOTROL) 10 MG tablet TAKE 1 TABLET BY MOUTH TWICE DAILY WITH FOOD. NEEDS APPOINTMENT FOR FUTURE REFILLS for 90   metFORMIN (GLUCOPHAGE-XR) 500 MG 24 hr tablet SMARTSIG:1 Tablet(s) By Mouth  Every Evening   pantoprazole (PROTONIX) 40 MG tablet Take 1 tablet (40 mg total) by mouth daily. Take 30-60 min before first meal of the day   rosuvastatin (CRESTOR) 20 MG tablet Take 1 tablet by mouth daily.   tamsulosin (FLOMAX) 0.4 MG CAPS capsule Take by mouth.   valsartan-hydrochlorothiazide (DIOVAN HCT) 160-12.5 MG tablet Take 1 tablet by mouth daily.                 Objective:     08/18/2022    233   06/23/22 231 lb 6.4 oz (105 kg)  05/24/22 235 lb (106.6 kg)    Vital signs reviewed  08/18/2022  - Note at rest 02 sats  97% on RA   General appearance:    amb somber bm nad   HEENT : Oropharynx  clear/ full dentures     Nasal turbinates nl    NECK :  without  apparent JVD/ palpable Nodes/TM    LUNGS: no acc muscle use,  Nl contour chest which is clear to A and P bilaterally without cough on insp or exp maneuvers   CV:  RRR  no s3 or murmur or increase in P2, and no edema   ABD:  soft and nontender with nl inspiratory excursion in the supine position. No bruits or organomegaly appreciated   MS:  Nl gait/ ext warm without deformities Or obvious joint restrictions  calf tenderness, cyanosis or clubbing    SKIN: warm and dry without lesions    NEURO:  alert, approp, nl sensorium with  no motor or cerebellar deficits apparent.          Assessment

## 2022-08-19 ENCOUNTER — Encounter: Payer: Self-pay | Admitting: Internal Medicine

## 2022-08-19 NOTE — Assessment & Plan Note (Addendum)
Onset  ? 1980s -  Allergy screen 05/24/2022 >  Eos 0.1 /  IgE  122 - trial off ACEi and on aggressive gerd rx plus 1st gen H1 blockers per guidelines   05/24/2022 >>>  Improved 90+ % 06/23/2022 > continue 1st gen H1 blockers per guidelines  And gerd rx x 2 m then return to taper off gerd rx  - cough recurred mid sept 2023 with FENO 08/18/2022  = 19 so unlikely to be Th2 mediated cough (eg allergic asthma)   rec repeat same rx as initial and then return in 4 weeks with all meds in hand using a trust but verify approach to confirm accurate Medication  Reconciliation The principal here is that until we are certain that the  patients are doing what we've asked, it makes no sense to ask them to do more.   Advised: The standardized cough guidelines published in Chest by Lissa Morales in 2006 are still the best available and consist of a multiple step process (up to 12!) , not a single office visit,  and are intended  to address this problem logically,  with an alogrithm dependent on response to empiric treatment at  each progressive step  to determine a specific diagnosis with  minimal addtional testing needed. Therefore if adherence is an issue or can't be accurately verified,  it's very unlikely the standard evaluation and treatment will be successful here.    Furthermore, response to therapy (other than acute cough suppression, which should only be used short term with avoidance of narcotic containing cough syrups if possible), can be a gradual process for which the patient is not likely to  perceive immediate benefit.  Unlike going to an eye doctor where the best perscription is almost always the first one and is immediately effective, this is almost never the case in the management of chronic cough syndromes. Therefore the patient needs to commit up front to consistently adhere to recommendations  for up to 6 weeks of therapy directed at the likely underlying problem(s) before the response can be reasonably  evaluated.

## 2022-09-02 ENCOUNTER — Other Ambulatory Visit: Payer: Self-pay | Admitting: Internal Medicine

## 2022-09-05 ENCOUNTER — Other Ambulatory Visit: Payer: Self-pay

## 2022-09-05 MED ORDER — PANTOPRAZOLE SODIUM 40 MG PO TBEC: 40.0000 mg | DELAYED_RELEASE_TABLET | Freq: Every day | ORAL | 2 refills | Status: DC

## 2022-09-29 ENCOUNTER — Ambulatory Visit: Payer: Medicare HMO | Admitting: Internal Medicine

## 2022-09-29 ENCOUNTER — Encounter: Payer: Self-pay | Admitting: Internal Medicine

## 2022-09-29 VITALS — BP 130/72 | HR 96 | Temp 97.7°F | Ht 68.0 in | Wt 231.2 lb

## 2022-09-29 DIAGNOSIS — R058 Other specified cough: Secondary | ICD-10-CM

## 2022-09-29 NOTE — Assessment & Plan Note (Signed)
Onset  ? 1980s -  Allergy screen 05/24/2022 >  Eos 0.1 /  IgE  122 - trial off ACEi and on aggressive gerd rx plus 1st gen H1 blockers per guidelines   05/24/2022 >>>  Improved 90+ % 06/23/2022 > continue 1st gen H1 blockers per guidelines  And gerd rx x 2 m then return to taper off gerd rx  - cough recurred mid sept 2023 with FENO 08/18/2022  = 19   Improved despite not following recs consistently so simply repeated the same ones with use of the "bag of pill bottles" approach to medication reconciliation.   Please schedule a follow up visit in 3 months  with all meds in hand using a trust but verify approach to confirm accurate Medication  Reconciliation The principal here is that until we are certain that the  patients are doing what we've asked, it makes no sense to ask them to do more.   Each maintenance medication was reviewed in detail including emphasizing most importantly the difference between maintenance and prns and under what circumstances the prns are to be triggered using an action plan format where appropriate.  Total time for H and P, chart review, counseling,  and generating customized AVS unique to this office visit / same day charting = 25 min

## 2022-09-29 NOTE — Progress Notes (Signed)
Ronald Rubio, male    DOB: 1940/12/03    MRN: [id number]   Brief patient profile:  40  yobm  quit smoking 2018  referred to pulmonary clinic in [place]  05/24/2022 by Noland Fordyce for cough  x years (?since childhood)  and sob since covid but can't remember when that was but not admitted.     History of Present Illness  05/24/2022  Pulmonary/ 1st office eval/ Ronald Rubio / [place] [facility]  Chief Complaint  Patient presents with   Consult    Chronic cough   Dyspnea:  only with digging / yardwork not better with saba  Cough: worse  hs  no am flare / no significant production assoc with sense  nasal congestion/pnds  Sleep: bed flat is flat  SABA use: twice  a day  Rec Stop lisinopril and the cough should gradually fade if not resolve completely over the next few weeks  Start valsartan 160 - 12.5 one daily  Depomedrol 120 mg IM  Cough > mucinex 1200 mg every 12 hours as needed  Stop zyrtec and For drainage / throat tickle try take CHLORPHENIRAMINE  4 mg  Pantoprazole (protonix) 40 mg   Take  30-60 min before first meal of the day and Pepcid (famotidine)  20 mg after supper until return to office GERD rx    05/24/2022 >  Eos 0.1 /  IgE  122      06/23/2022  f/u ov/[place] office/Ronald Rubio re: longstanding  cough  maint on gerd rx  and off ACEi since last ov Chief Complaint  Patient presents with   Follow-up    Cough has improved   Dyspnea:  back at work at [employer] not limited  Cough: almost completely gone / minimal pnds controlled with 1st gen H1 blockers   Sleeping: flat bed/ 2 pillows  SABA use: none  02: none  Covid status: vax x 3  Rec For drainage / throat tickle /sneezing/ try take CHLORPHENIRAMINE  4 mg       08/18/2022  f/u ov/[place] office/Ronald Rubio re: recurrent cough x years  maint on gerd rx   Chief Complaint  Patient presents with   Follow-up    Still coughing- states cough goes away and comes back. He is now coughing up white sputum  Dyspnea:  no trouble  with work  Cough: resolved for a week or two then comes back "like a cold"  Sleeping: white mucus does keep him up despite h1 at hs  SABA use: none  02: none Rec Depomedrol 120 mg IM  Cough > mucinex 1200 mg every 12 hours as needed  Stop zyrtec and For drainage / throat tickle try take CHLORPHENIRAMINE  4 mg  Pantoprazole (protonix) 40 mg   Take  30-60 min before first meal of the day and Pepcid (famotidine)  20 mg after supper until return to office  GERD diet reviewed, bed blocks rec  Please schedule a follow up office visit in 4 weeks, sooner if needed  with all medications /inhalers/ solutions in hand    09/29/2022  f/u ov/[place] office/Ronald Rubio re: cough comes and goes to years maint on gerd rx but not taking as rec  Chief Complaint  Patient presents with   Follow-up    Wheezing has improved  Cough comes and goes   Dyspnea:  no trouble with work very active but not steps Cough: worse at bedtime not taking h1 or h2 correclty  Sleeping: better  SABA use: none  02:  none      No obvious day to day or daytime variability or assoc excess/ purulent sputum or mucus plugs or hemoptysis or cp or chest tightness, subjective wheeze or overt sinus or hb symptoms.    . Also denies any obvious fluctuation of symptoms with weather or environmental changes or other aggravating or alleviating factors except as outlined above   No unusual exposure hx or h/o childhood pna/ asthma or knowledge of premature birth.  Current Allergies, Complete Past Medical History, Past Surgical History, Family History, and Social History were reviewed in [employer] record.  ROS  The following are not active complaints unless bolded Hoarseness, sore throat, dysphagia, dental problems, itching, sneezing,  nasal congestion or discharge of excess mucus or purulent secretions, ear ache,   fever, chills, sweats, unintended wt loss or wt gain, classically pleuritic or exertional cp,  orthopnea  pnd or arm/hand swelling  or leg swelling, presyncope, palpitations, abdominal pain, anorexia, nausea, vomiting, diarrhea  or change in bowel habits or change in bladder habits, change in stools or change in urine, dysuria, hematuria,  rash, arthralgias, visual complaints, headache, numbness, weakness or ataxia or problems with walking or coordination,  change in mood or  memory.        Current Meds  Medication Sig   aspirin EC 81 MG tablet Take 1 tablet by mouth daily.   chlorpheniramine (CHLOR-TRIMETON) 4 MG tablet 1 tablet as needed Orally every 4 hrs   famotidine (PEPCID) 20 MG tablet Take 20 mg by mouth 2 (two) times daily.   glipiZIDE (GLUCOTROL) 10 MG tablet TAKE 1 TABLET BY MOUTH TWICE DAILY WITH FOOD. NEEDS APPOINTMENT FOR FUTURE REFILLS for 90   metFORMIN (GLUCOPHAGE-XR) 500 MG 24 hr tablet SMARTSIG:1 Tablet(s) By Mouth Every Evening   pantoprazole (PROTONIX) 40 MG tablet TAKE 1 TABLET BY MOUTH ONCE DAILY 30-60 MINUTES BEFORE FIRST MEAL OF THE DAY   pantoprazole (PROTONIX) 40 MG tablet Take 1 tablet (40 mg total) by mouth daily.   rosuvastatin (CRESTOR) 20 MG tablet Take 1 tablet by mouth daily.   tamsulosin (FLOMAX) 0.4 MG CAPS capsule Take by mouth.   valsartan-hydrochlorothiazide (DIOVAN HCT) 160-12.5 MG tablet Take 1 tablet by mouth daily.              Objective:    Wts  09/29/2022       231  08/18/2022    233   06/23/22 231 lb 6.4 oz (105 kg)  05/24/22 235 lb (106.6 kg)    Vital signs reviewed  09/29/2022  - Note at rest 02 sats  96% on RA   General appearance:    obese bm/ mild pw better with plm    HEENT : Oropharynx  clear      Nasal turbinates nl    NECK :  without  apparent JVD/ palpable Nodes/TM    LUNGS: no acc muscle use,  Nl contour chest which is clear to A and P bilaterally without cough on insp or exp maneuvers   CV:  RRR  no s3 or murmur or increase in P2, and no edema   ABD: obese  soft and nontender with nl inspiratory excursion in the supine  position. No bruits or organomegaly appreciated   MS:  Nl gait/ ext warm without deformities Or obvious joint restrictions  calf tenderness, cyanosis or clubbing    SKIN: warm and dry without lesions    NEURO:  alert, approp, nl sensorium with  no motor or  cerebellar deficits apparent.          Assessment

## 2022-09-29 NOTE — Patient Instructions (Addendum)
Take chlorpheniramine 4 mg 1or 2 about an hour before bedtime along with famotidine 10 mg x 2  Pantoprazole 40 mg Take 30-60 min before first meal of the day   GERD (REFLUX)  is an extremely common cause of respiratory symptoms just like yours , many times with no obvious heartburn at all.    It can be treated with medication, but also with lifestyle changes including elevation of the head of your bed (ideally with 6 -8inch blocks under the headboard of your bed),  Smoking cessation, avoidance of late meals, excessive alcohol, and avoid fatty foods, chocolate, peppermint, colas, red wine, and acidic juices such as orange juice.  NO MINT OR MENTHOL PRODUCTS SO NO COUGH DROPS  USE SUGARLESS CANDY INSTEAD (Jolley ranchers or Stover's or Life Savers) or even ice chips will also do - the key is to swallow to prevent all throat clearing. NO OIL BASED VITAMINS - use powdered substitutes.  Avoid fish oil when coughing.    Please schedule a follow up visit in 3 months but call sooner if needed  with all medications /inhalers/ solutions in hand so we can verify exactly what you are taking. This includes all medications from all doctors and over the counters

## 2023-01-02 ENCOUNTER — Other Ambulatory Visit: Payer: Self-pay | Admitting: Internal Medicine

## 2023-01-02 NOTE — Progress Notes (Unsigned)
Ronald Rubio, male    DOB: 1941/06/19    MRN: [id number]   Brief patient profile:  77  yobm  quit smoking 2018  referred to pulmonary clinic in [place]  05/24/2022 by Luevenia Maxin for cough  x years (?since childhood)  and sob since covid but can't remember when that was but not admitted.     History of Present Illness  05/24/2022  Pulmonary/ 1st office eval/ Jayleene Glaeser / [place] [facility]  Chief Complaint  Patient presents with   Consult    Chronic cough   Dyspnea:  only with digging / yardwork not better with saba  Cough: worse  hs  no am flare / no significant production assoc with sense  nasal congestion/pnds  Sleep: bed flat is flat  SABA use: twice  a day  Rec Stop lisinopril and the cough should gradually fade if not resolve completely over the next few weeks  Start valsartan 160 - 12.5 one daily  Depomedrol 120 mg IM  Cough > mucinex 1200 mg every 12 hours as needed  Stop zyrtec and For drainage / throat tickle try take CHLORPHENIRAMINE  4 mg  Pantoprazole (protonix) 40 mg   Take  30-60 min before first meal of the day and Pepcid (famotidine)  20 mg after supper until return to office GERD rx    05/24/2022 >  Eos 0.1 /  IgE  122     08/18/2022  f/u ov/[place] office/Eastyn Skalla re: recurrent cough x years  maint on gerd rx   Chief Complaint  Patient presents with   Follow-up    Still coughing- states cough goes away and comes back. He is now coughing up white sputum  Dyspnea:  no trouble with work  Cough: resolved for a week or two then comes back "like a cold"  Sleeping: white mucus does keep him up despite h1 at hs  SABA use: none  02: none Rec Depomedrol 120 mg IM  Cough > mucinex 1200 mg every 12 hours as needed  Stop zyrtec and For drainage / throat tickle try take CHLORPHENIRAMINE  4 mg  Pantoprazole (protonix) 40 mg   Take  30-60 min before first meal of the day and Pepcid (famotidine)  20 mg after supper until return to office  GERD diet reviewed, bed blocks rec   Please schedule a follow up office visit in 4 weeks, sooner if needed  with all medications /inhalers/ solutions in hand    09/29/2022  f/u ov/[place] office/Alfonso Shackett re: cough comes and goes to years maint on gerd rx but not taking as rec  Chief Complaint  Patient presents with   Follow-up    Wheezing has improved  Cough comes and goes   Dyspnea:  no trouble with work very active but not steps Cough: worse at bedtime not taking h1 or h2 correclty  Sleeping: better  SABA use: none  02: none  Rec Take chlorpheniramine 4 mg 1or 2 about an hour before bedtime along with famotidine 10 mg x 2 Pantoprazole 40 mg Take 30-60 min before first meal of the day  GERD diet/bed blocks Please schedule a follow up visit in 3 months but call sooner if needed  with all medications /inhalers/ solutions in hand     01/03/2023  f/u ov/[place] office/Trumaine Wimer re: cough x late 1980s  maint on pepcid 20 mg / protonix/ working  Chief Complaint  Patient presents with   Follow-up    Cough is better but still bothering patient.  Dyspnea:  Not limited by breathing from desired activities   Cough: one hour p lie down coughs real hard dry hack  Sleeping: not using bed blocks SABA use: did not help  02: none      No obvious day to day or daytime variability or assoc excess/ purulent sputum or mucus plugs or hemoptysis or cp or chest tightness, subjective wheeze or overt sinus or hb symptoms.   *** without nocturnal  or early am exacerbation  of respiratory  c/o's or need for noct saba. Also denies any obvious fluctuation of symptoms with weather or environmental changes or other aggravating or alleviating factors except as outlined above   No unusual exposure hx or h/o childhood pna/ asthma or knowledge of premature birth.  Current Allergies, Complete Past Medical History, Past Surgical History, Family History, and Social History were reviewed in [employer] record.  ROS  The  following are not active complaints unless bolded Hoarseness, sore throat, dysphagia, dental problems, itching, sneezing,  nasal congestion or discharge of excess mucus or purulent secretions, ear ache,   fever, chills, sweats, unintended wt loss or wt gain, classically pleuritic or exertional cp,  orthopnea pnd or arm/hand swelling  or leg swelling, presyncope, palpitations, abdominal pain, anorexia, nausea, vomiting, diarrhea  or change in bowel habits or change in bladder habits, change in stools or change in urine, dysuria, hematuria,  rash, arthralgias, visual complaints, headache, numbness, weakness or ataxia or problems with walking or coordination,  change in mood or  memory.        Current Meds  Medication Sig   aspirin EC 81 MG tablet Take 1 tablet by mouth daily.   chlorpheniramine (CHLOR-TRIMETON) 4 MG tablet 1 tablet as needed Orally every 4 hrs   famotidine (PEPCID) 20 MG tablet Take 20 mg by mouth 2 (two) times daily.   glipiZIDE (GLUCOTROL) 10 MG tablet TAKE 1 TABLET BY MOUTH TWICE DAILY WITH FOOD. NEEDS APPOINTMENT FOR FUTURE REFILLS for 90   metFORMIN (GLUCOPHAGE-XR) 500 MG 24 hr tablet SMARTSIG:1 Tablet(s) By Mouth Every Evening   pantoprazole (PROTONIX) 40 MG tablet TAKE 1 TABLET BY MOUTH ONCE DAILY 30-60 MINUTES BEFORE FIRST MEAL OF THE DAY   pantoprazole (PROTONIX) 40 MG tablet Take 1 tablet by mouth once daily   rosuvastatin (CRESTOR) 20 MG tablet Take 1 tablet by mouth daily.   tamsulosin (FLOMAX) 0.4 MG CAPS capsule Take by mouth.   valsartan-hydrochlorothiazide (DIOVAN HCT) 160-12.5 MG tablet Take 1 tablet by mouth daily.                       Objective:    Wts  01/03/2023        ***  09/29/2022       231  08/18/2022    233   06/23/22 231 lb 6.4 oz (105 kg)  05/24/22 235 lb (106.6 kg)    Vital signs reviewed  01/03/2023  - Note at rest 02 sats  ***% on ***   General appearance:    hoarse bm              Assessment

## 2023-01-03 ENCOUNTER — Encounter: Payer: Self-pay | Admitting: Internal Medicine

## 2023-01-03 ENCOUNTER — Ambulatory Visit: Payer: Medicare HMO | Admitting: Internal Medicine

## 2023-01-03 VITALS — BP 134/76 | HR 94 | Ht 68.0 in | Wt 236.6 lb

## 2023-01-03 DIAGNOSIS — R058 Other specified cough: Secondary | ICD-10-CM

## 2023-01-03 MED ORDER — PANTOPRAZOLE SODIUM 40 MG PO TBEC: DELAYED_RELEASE_TABLET | ORAL | 11 refills | Status: AC

## 2023-01-03 MED ORDER — METHYLPREDNISOLONE ACETATE 80 MG/ML IJ SUSP
120.0000 mg | Freq: Once | INTRAMUSCULAR | Status: AC
  Administered 2023-01-03: 120 mg via INTRAMUSCULAR

## 2023-01-03 NOTE — Patient Instructions (Signed)
8 inch bed blocks or risers   Try take CHLORPHENIRAMINE  4 mg  ("Allergy Relief" '4mg'$   at [facility] should be easiest to find in the blue box usually on bottom shelf)  take 2 every 4 hours as needed -  take 2 after  supper and 2 4 hours later as needed   Depomedrol 120 mg IM today - if cough goes away  x 5 days then returns, call for singulair trial    Please schedule a follow up office visit in 6 weeks, call sooner if needed

## 2023-01-04 ENCOUNTER — Encounter: Payer: Self-pay | Admitting: Internal Medicine

## 2023-01-04 NOTE — Assessment & Plan Note (Signed)
Onset  ? 1980s -  Allergy screen 05/24/2022 >  Eos 0.1 /  IgE  122 - trial off ACEi and on aggressive gerd rx plus 1st gen H1 blockers per guidelines   05/24/2022 >>>  Improved 90+ % 06/23/2022 > continue 1st gen H1 blockers per guidelines  And gerd rx x 2 m then return to taper off gerd rx  - cough recurred mid sept 2023 with FENO 08/18/2022  = 19   Of the three most common causes of  Sub-acute / recurrent or chronic cough, only one (GERD)  can actually contribute to/ trigger  the other two (asthma and post nasal drip syndrome)  and perpetuate the cylce of cough.  While not intuitively obvious, many patients with chronic low grade reflux do not cough until there is a primary insult that disturbs the protective epithelial barrier and exposes sensitive nerve endings.   This is typically viral but can due to PNDS and  either may apply here.     >>>The point is that once this occurs, it is difficult to eliminate the cycle  using anything but a maximally effective acid suppression regimen at least in the short run, accompanied by an appropriate diet to address non acid GERD and control / eliminate all pnds with more aggressive 1st gen H1 blockers per guidelines  esp at hs with use of bed blocks x 8 in and >>> also   added depomedrol 120 mg  in case of component of Th-2 driven upper or lower airways inflammation (if cough responds short term only to relapse before return while will on full rx for uacs (as above), then  that would point to allergic rhinitis/ asthma or eos bronchitis as alternative dx) and consider singulair/ formal allergy w/u   F/u in 6 weeks          Each maintenance medication was reviewed in detail including emphasizing most importantly the difference between maintenance and prns and under what circumstances the prns are to be triggered using an action plan format where appropriate.  Total time for H and P, chart review, counseling,  and generating customized AVS unique to this office  visit / same day charting  > 30 min for   refractory respiratory  symptoms of uncertain etiology

## 2023-02-13 NOTE — Progress Notes (Deleted)
Ronald Rubio, male    DOB: October 09, 1941    MRN: [id number]   Brief patient profile:  74  yobm  quit smoking 2018  referred to pulmonary clinic in [place]  05/24/2022 by Noland Fordyce for cough  x years (?since childhood)  and sob since covid but can't remember when that was but not admitted.     History of Present Illness  05/24/2022  Pulmonary/ 1st office eval/ Sharmayne Jablon / [place] [facility]  Chief Complaint  Patient presents with   Consult    Chronic cough   Dyspnea:  only with digging / yardwork not better with saba  Cough: worse  hs  no am flare / no significant production assoc with sense  nasal congestion/pnds  Sleep: bed flat is flat  SABA use: twice  a day  Rec Stop lisinopril and the cough should gradually fade if not resolve completely over the next few weeks  Start valsartan 160 - 12.5 one daily  Depomedrol 120 mg IM  Cough > mucinex 1200 mg every 12 hours as needed  Stop zyrtec and For drainage / throat tickle try take CHLORPHENIRAMINE  4 mg  Pantoprazole (protonix) 40 mg   Take  30-60 min before first meal of the day and Pepcid (famotidine)  20 mg after supper until return to office GERD rx    05/24/2022 >  Eos 0.1 /  IgE  122     08/18/2022  f/u ov/[place] office/Cheyanne Lamison re: recurrent cough x years  maint on gerd rx   Chief Complaint  Patient presents with   Follow-up    Still coughing- states cough goes away and comes back. He is now coughing up white sputum  Dyspnea:  no trouble with work  Cough: resolved for a week or two then comes back "like a cold"  Sleeping: white mucus does keep him up despite h1 at hs  SABA use: none  02: none Rec Depomedrol 120 mg IM  Cough > mucinex 1200 mg every 12 hours as needed  Stop zyrtec and For drainage / throat tickle try take CHLORPHENIRAMINE  4 mg  Pantoprazole (protonix) 40 mg   Take  30-60 min before first meal of the day and Pepcid (famotidine)  20 mg after supper until return to office  GERD diet reviewed, bed blocks rec   Please schedule a follow up office visit in 4 weeks, sooner if needed  with all medications /inhalers/ solutions in hand    09/29/2022  f/u ov/[place] office/Jay Kempe re: cough comes and goes to years maint on gerd rx but not taking as rec  Chief Complaint  Patient presents with   Follow-up    Wheezing has improved  Cough comes and goes   Dyspnea:  no trouble with work very active but not steps Cough: worse at bedtime not taking h1 or h2 correclty  Sleeping: better  SABA use: none  02: none  Rec Take chlorpheniramine 4 mg 1or 2 about an hour before bedtime along with famotidine 10 mg x 2 Pantoprazole 40 mg Take 30-60 min before first meal of the day  GERD diet/bed blocks Please schedule a follow up visit in 3 months but call sooner if needed  with all medications /inhalers/ solutions in hand     01/03/2023  f/u ov/[place] office/Herta Hink re: cough x late 1980s  maint on pepcid 20 mg / protonix/ takes h1 p supper and helps some but only x 2 then wakes up coughing several hours p hs  - says  cough was gone for about a week p lst ov then came back s change in meds  Chief Complaint  Patient presents with   Follow-up    Cough is better but still bothering patient.   Dyspnea:  Not limited by breathing from desired activities   Cough: one hour p lie down coughs real hard dry hack  Sleeping: not using bed blocks SABA use: did not help  02: none   Rec 8 inch bed blocks or risers  Try take CHLORPHENIRAMINE  4 mg   Depomedrol 120 mg IM today - if cough goes away  x 5 days then returns, call for singulair trial     02/15/2023  f/u ov/[place] office/Brianne Maina re: *** maint on ***  No chief complaint on file.   Dyspnea:  *** Cough: *** Sleeping: *** SABA use: *** 02: *** Covid status: *** Lung cancer screening: ***   No obvious day to day or daytime variability or assoc excess/ purulent sputum or mucus plugs or hemoptysis or cp or chest tightness, subjective wheeze or overt sinus or hb  symptoms.   *** without nocturnal  or early am exacerbation  of respiratory  c/o's or need for noct saba. Also denies any obvious fluctuation of symptoms with weather or environmental changes or other aggravating or alleviating factors except as outlined above   No unusual exposure hx or h/o childhood pna/ asthma or knowledge of premature birth.  Current Allergies, Complete Past Medical History, Past Surgical History, Family History, and Social History were reviewed in [employer] record.  ROS  The following are not active complaints unless bolded Hoarseness, sore throat, dysphagia, dental problems, itching, sneezing,  nasal congestion or discharge of excess mucus or purulent secretions, ear ache,   fever, chills, sweats, unintended wt loss or wt gain, classically pleuritic or exertional cp,  orthopnea pnd or arm/hand swelling  or leg swelling, presyncope, palpitations, abdominal pain, anorexia, nausea, vomiting, diarrhea  or change in bowel habits or change in bladder habits, change in stools or change in urine, dysuria, hematuria,  rash, arthralgias, visual complaints, headache, numbness, weakness or ataxia or problems with walking or coordination,  change in mood or  memory.        No outpatient medications have been marked as taking for the 02/15/23 encounter (Appointment) with Nyoka Cowden, MD.                       Objective:    Wts  02/15/2023       ***  01/03/2023       236  09/29/2022       231  08/18/2022    233   06/23/22 231 lb 6.4 oz (105 kg)  05/24/22 235 lb (106.6 kg)      Vital signs reviewed  02/15/2023  - Note at rest 02 sats  ***% on ***   General appearance:    ***          Assessment

## 2023-02-15 ENCOUNTER — Ambulatory Visit: Payer: Medicare HMO | Admitting: Internal Medicine
